# Patient Record
Sex: Female | Born: 1955 | Race: White | Hispanic: No | Marital: Married
Health system: Southern US, Community
[De-identification: ages and names within clinical notes are randomized; demographics above are authoritative.]

---

## 2017-09-26 ENCOUNTER — Ambulatory Visit: Payer: BLUE CROSS/BLUE SHIELD

## 2017-09-26 ENCOUNTER — Ambulatory Visit (INDEPENDENT_AMBULATORY_CARE_PROVIDER_SITE_OTHER): Payer: BLUE CROSS/BLUE SHIELD | Admitting: Podiatry

## 2017-09-26 ENCOUNTER — Encounter: Payer: Self-pay | Admitting: Podiatry

## 2017-09-26 ENCOUNTER — Ambulatory Visit (INDEPENDENT_AMBULATORY_CARE_PROVIDER_SITE_OTHER): Payer: BLUE CROSS/BLUE SHIELD

## 2017-09-26 VITALS — BP 127/83 | HR 96 | Resp 18

## 2017-09-26 DIAGNOSIS — M2042 Other hammer toe(s) (acquired), left foot: Secondary | ICD-10-CM | POA: Diagnosis not present

## 2017-09-26 DIAGNOSIS — M19079 Primary osteoarthritis, unspecified ankle and foot: Secondary | ICD-10-CM | POA: Diagnosis not present

## 2017-09-26 NOTE — Progress Notes (Signed)
Subjective:    Patient ID: Penny Riley, female    DOB: Aug 03, 1955, 62 y.o.   MRN: [id number]  HPI 62 year old female presents the office today for concerns of pain to the bottom of her left fourth toe which is been ongoing since about 2004.  She never had any kind of treatment for it.  She states that sometimes when she wears memory from it seems to hurt the area more.  She denies any recent injury or trauma she denies any numbness or tingling.  She has no other concerns today.   Review of Systems  All other systems reviewed and are negative.  History reviewed. No pertinent past medical history.  History reviewed. No pertinent surgical history.   Current Outpatient Medications:  .  amLODipine (NORVASC) 5 MG tablet, Take 5 mg by mouth daily., Disp: , Rfl:  .  buPROPion (WELLBUTRIN XL) 150 MG 24 hr tablet, Take 150 mg by mouth daily., Disp: , Rfl:   Allergies  Allergen Reactions  . Ciprocin-Fluocin-Procin [Fluocinolone]   . Penicillins     Social History   Socioeconomic History  . Marital status: Married    Spouse name: Not on file  . Number of children: Not on file  . Years of education: Not on file  . Highest education level: Not on file  Occupational History  . Not on file  Social Needs  . Financial resource strain: Not on file  . Food insecurity:    Worry: Not on file    Inability: Not on file  . Transportation needs:    Medical: Not on file    Non-medical: Not on file  Tobacco Use  . Smoking status: Unknown If Ever Smoked  . Smokeless tobacco: Never Used  Substance and Sexual Activity  . Alcohol use: Not on file  . Drug use: Not on file  . Sexual activity: Not on file  Lifestyle  . Physical activity:    Days per week: Not on file    Minutes per session: Not on file  . Stress: Not on file  Relationships  . Social connections:    Talks on phone: Not on file    Gets together: Not on file    Attends religious service: Not on file    Active member of  club or organization: Not on file    Attends meetings of clubs or organizations: Not on file    Relationship status: Not on file  . Intimate partner violence:    Fear of current or ex partner: Not on file    Emotionally abused: Not on file    Physically abused: Not on file    Forced sexual activity: Not on file  Other Topics Concern  . Not on file  Social History Narrative  . Not on file         Objective:   Physical Exam  General: AAO x3, NAD  Dermatological: Skin is warm, dry and supple bilateral. Nails x 10 are well manicured; remaining integument appears unremarkable at this time. There are no open sores, no preulcerative lesions, no rash or signs of infection present.  Vascular: Dorsalis Pedis artery and Posterior Tibial artery pedal pulses are 2/4 bilateral with immedate capillary fill time. Pedal hair growth present. No varicosities and no lower extremity edema present bilateral. There is no pain with calf compression, swelling, warmth, erythema.   Neruologic: Grossly intact via light touch bilateral. Vibratory intact via tuning fork bilateral. Protective threshold with Semmes Wienstein monofilament intact to  all pedal sites bilateral. Patellar and Achilles deep tendon reflexes 2+ bilateral. No Babinski or clonus noted bilateral.   Musculoskeletal: There is a mild bony prominence on the plantar aspect of the left fourth toe at the PIPJ.  There is to be slightly more prominent compared to the other digits.  There is no edema, erythema, increased warmth.  This is where she also has the tenderness.  There is no other area of tenderness identified at this time.  Muscular strength 5/5 in all groups tested bilateral.  Gait: Unassisted, Nonantalgic.     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  There is no evidence of acute fracture or stress fracture. -This is been a chronic issue for her.  Discussed.  Offloading pads to help take pressure off the area as well as the change in shoes.  Overall unfortunately there is not much other treatment we can do for it at this point.  Her pain is intermittent.  We will continue offloading to see how this will help.  She states that she knew that there was not much that could be done for the toe but she wanted to have it checked.  -She also has history of plantar fasciitis.  Discussed gentle stretching exercises as well as inserts for shoes.  Vivi Barrack DPM

## 2017-12-08 ENCOUNTER — Other Ambulatory Visit: Payer: Self-pay | Admitting: Family Medicine

## 2017-12-08 DIAGNOSIS — Z1231 Encounter for screening mammogram for malignant neoplasm of breast: Secondary | ICD-10-CM

## 2018-01-12 ENCOUNTER — Ambulatory Visit
Admission: RE | Admit: 2018-01-12 | Discharge: 2018-01-12 | Disposition: A | Discharge status: Home / Self Care | Payer: BLUE CROSS/BLUE SHIELD | Source: Ambulatory Visit | Attending: Family Medicine | Admitting: Family Medicine

## 2018-01-12 ENCOUNTER — Encounter: Payer: Self-pay | Admitting: Radiology

## 2018-01-12 DIAGNOSIS — Z1231 Encounter for screening mammogram for malignant neoplasm of breast: Secondary | ICD-10-CM

## 2020-03-17 ENCOUNTER — Other Ambulatory Visit: Payer: Self-pay | Admitting: Family Medicine

## 2020-03-17 DIAGNOSIS — Z1231 Encounter for screening mammogram for malignant neoplasm of breast: Secondary | ICD-10-CM

## 2020-03-20 ENCOUNTER — Other Ambulatory Visit: Payer: Self-pay

## 2020-03-20 ENCOUNTER — Ambulatory Visit
Admission: RE | Admit: 2020-03-20 | Discharge: 2020-03-20 | Disposition: A | Discharge status: Home / Self Care | Payer: BLUE CROSS/BLUE SHIELD | Source: Ambulatory Visit | Attending: Family Medicine | Admitting: Family Medicine

## 2020-03-20 DIAGNOSIS — Z1231 Encounter for screening mammogram for malignant neoplasm of breast: Secondary | ICD-10-CM

## 2020-05-10 ENCOUNTER — Other Ambulatory Visit: Payer: Self-pay | Admitting: Gastroenterology

## 2020-05-10 DIAGNOSIS — R748 Abnormal levels of other serum enzymes: Secondary | ICD-10-CM

## 2020-05-12 ENCOUNTER — Ambulatory Visit
Admission: RE | Admit: 2020-05-12 | Discharge: 2020-05-12 | Disposition: A | Discharge status: Home / Self Care | Payer: 59 | Source: Ambulatory Visit | Attending: Gastroenterology | Admitting: Gastroenterology

## 2020-05-12 DIAGNOSIS — R748 Abnormal levels of other serum enzymes: Secondary | ICD-10-CM

## 2020-05-17 ENCOUNTER — Other Ambulatory Visit: Payer: Self-pay | Admitting: Gastroenterology

## 2020-05-17 DIAGNOSIS — R9389 Abnormal findings on diagnostic imaging of other specified body structures: Secondary | ICD-10-CM

## 2020-05-17 DIAGNOSIS — N289 Disorder of kidney and ureter, unspecified: Secondary | ICD-10-CM

## 2020-05-23 ENCOUNTER — Other Ambulatory Visit: Payer: Self-pay | Admitting: Gastroenterology

## 2020-05-23 ENCOUNTER — Other Ambulatory Visit (HOSPITAL_COMMUNITY): Payer: Self-pay | Admitting: Gastroenterology

## 2020-05-23 DIAGNOSIS — K754 Autoimmune hepatitis: Secondary | ICD-10-CM

## 2020-05-24 ENCOUNTER — Other Ambulatory Visit: Payer: Self-pay | Admitting: Gastroenterology

## 2020-05-31 ENCOUNTER — Ambulatory Visit
Admission: RE | Admit: 2020-05-31 | Discharge: 2020-05-31 | Disposition: A | Discharge status: Home / Self Care | Payer: 59 | Source: Ambulatory Visit | Attending: Gastroenterology | Admitting: Gastroenterology

## 2020-05-31 DIAGNOSIS — N289 Disorder of kidney and ureter, unspecified: Secondary | ICD-10-CM

## 2020-05-31 DIAGNOSIS — R9389 Abnormal findings on diagnostic imaging of other specified body structures: Secondary | ICD-10-CM

## 2020-05-31 MED ORDER — IOPAMIDOL (ISOVUE-300) INJECTION 61%
100.0000 mL | Freq: Once | INTRAVENOUS | Status: AC | PRN
  Administered 2020-05-31: 100 mL via INTRAVENOUS

## 2020-06-07 ENCOUNTER — Other Ambulatory Visit: Payer: Self-pay | Admitting: Student

## 2020-06-07 NOTE — H&P (Signed)
Chief Complaint: Patient was seen in consultation today for a random liver biopsy  Referring Physician(s): Kerin Salen  Supervising Physician: Oley Balm  Patient Status: [facility] - Out-pt  History of Present Illness: Penny Riley is a 64 y.o. female with a medical history significant for depression and hypertension. Recent lab work prior to her colonoscopy revealed elevated alkaline phosphatase levels. Additional labs were ordered and revealed high levels of actin smooth muscle antibodies. There is concern for autoimmune hepatitis.   Interventional Radiology has been asked to evaluate this patient for an image-guided random liver biopsy for further work up and evaluation.   Allergies: Ciprofloxacin and Penicillins  Medications: Prior to Admission medications   Medication Sig Start Date End Date Taking? Authorizing Provider  acetaminophen (TYLENOL) 500 MG tablet Take 500-1,000 mg by mouth every 6 (six) hours as needed for moderate pain or headache.   Yes [provider]  amLODipine (NORVASC) 5 MG tablet Take 5 mg by mouth daily.   Yes [provider]  atorvastatin (LIPITOR) 20 MG tablet Take 20 mg by mouth daily.   Yes [provider]  buPROPion (WELLBUTRIN XL) 150 MG 24 hr tablet Take 150 mg by mouth daily. Brand Name   Yes [provider]  Ferrous Sulfate (SLOW FE) 142 (45 Fe) MG TBCR Take 45 mg by mouth daily.   Yes [provider]  fluticasone (FLONASE) 50 MCG/ACT nasal spray Place 1 spray into both nostrils daily as needed for allergies or rhinitis.   Yes [provider]  ibuprofen (ADVIL) 200 MG tablet Take 400 mg by mouth every 6 (six) hours as needed for headache or moderate pain.   Yes [provider]  phenylephrine (SUDAFED PE) 10 MG TABS tablet Take 10 mg by mouth every 4 (four) hours as needed (congestion).    Yes [provider]     History reviewed. No pertinent family history.  Social  History   Socioeconomic History  . Marital status: Married    Spouse name: Not on file  . Number of children: Not on file  . Years of education: Not on file  . Highest education level: Not on file  Occupational History  . Not on file  Tobacco Use  . Smoking status: Unknown If Ever Smoked  . Smokeless tobacco: Never Used  Vaping Use  . Vaping Use: Never used  Substance and Sexual Activity  . Alcohol use: Never  . Drug use: Never  . Sexual activity: Not on file  Other Topics Concern  . Not on file  Social History Narrative  . Not on file   Social Determinants of Health   Financial Resource Strain: Not on file  Food Insecurity: Not on file  Transportation Needs: Not on file  Physical Activity: Not on file  Stress: Not on file  Social Connections: Not on file    Review of Systems: A 12 point ROS discussed and pertinent positives are indicated in the HPI above.  All other systems are negative.  Review of Systems  Constitutional: Negative for appetite change and fatigue.  Respiratory: Negative for cough.   Cardiovascular: Negative for chest pain and leg swelling.  Gastrointestinal: Negative for abdominal pain, diarrhea, nausea and vomiting.  Genitourinary: Negative for flank pain.  Musculoskeletal: Negative for back pain.  Neurological: Negative for headaches.  Hematological: Does not bruise/bleed easily.    Vital Signs: BP 136/86   Pulse 82   Temp 97.8 F (36.6 C) (Oral)   Resp 16  Ht 5' 7.5" (1.715 m)   Wt 207 lb (93.9 kg)   SpO2 96%   BMI 31.94 kg/m   Physical Exam Constitutional:      General: She is not in acute distress. HENT:     Mouth/Throat:     Mouth: Mucous membranes are moist.     Pharynx: Oropharynx is clear.  Cardiovascular:     Rate and Rhythm: Normal rate and regular rhythm.     Pulses: Normal pulses.     Heart sounds: Normal heart sounds.  Pulmonary:     Effort: Pulmonary effort is normal.     Breath sounds: Normal breath sounds.   Abdominal:     General: Bowel sounds are normal.     Palpations: Abdomen is soft.  Musculoskeletal:        General: Normal range of motion.  Skin:    General: Skin is warm and dry.  Neurological:     Mental Status: She is alert and oriented to person, place, and time.     Imaging: CT ABDOMEN W WO CONTRAST  Result Date: 06/01/2020 CLINICAL DATA:  Lesion on RIGHT kidney on ultrasound of the abdomen of May 12, 2020 in this 64 year old female EXAM: CT ABDOMEN WITHOUT AND WITH CONTRAST TECHNIQUE: Multidetector CT imaging of the abdomen was performed following the standard protocol before and following the bolus administration of intravenous contrast. CONTRAST:  ISOVUE-300 IOPAMIDOL (ISOVUE-300) INJECTION 61% COMPARISON:  May 12, 2020 FINDINGS: Lower chest: Lung bases are clear. Hepatobiliary: No focal, suspicious hepatic lesion. Portal vein is patent. No pericholecystic stranding or biliary duct dilation. Pancreas: Normal Spleen: Normal Adrenals/Urinary Tract: Adrenal glands are normal. Symmetric renal enhancement. Mild RIGHT renal cortical scarring in the interpolar RIGHT kidney 1.6 x 1.5 cm area arising from the upper pole the RIGHT kidney shows baseline density of 20 Hounsfield units, postcontrast density of 24 Hounsfield units and delayed density of 17 Hounsfield units. No suspicious renal lesions are demonstrated. No hydronephrosis. Stomach/Bowel: Small hiatal hernia. Otherwise unremarkable appearance of the visualized gastrointestinal tract. Vascular/Lymphatic: Minimal atherosclerotic calcification of the abdominal aorta. No aneurysmal dilation. There is no gastrohepatic or hepatoduodenal ligament lymphadenopathy. No retroperitoneal or mesenteric lymphadenopathy. Other: No ascites Musculoskeletal: Spinal degenerative changes without acute or destructive bone process IMPRESSION: 1. Area of concern arising from the upper pole the RIGHT kidney represents a cyst perhaps with small  amount of proteinaceous or hemorrhagic contents, no mural nodularity or suspicious features, Bosniak category II. 2. Small hiatal hernia. 3. Aortic atherosclerosis. Aortic Atherosclerosis (ICD10-I70.0). Electronically Signed   By: Donzetta Kohut M.D.   On: 06/01/2020 09:56   US Abdomen Limited RUQ (LIVER/GB)  Result Date: 05/12/2020 CLINICAL DATA:  Elevated alkaline phosphatase EXAM: ULTRASOUND ABDOMEN LIMITED RIGHT UPPER QUADRANT COMPARISON:  None. FINDINGS: Gallbladder: No gallstones or wall thickening visualized. No sonographic Murphy sign noted by sonographer. Common bile duct: Diameter: 3 mm. Liver: No focal lesion identified. Mildly increased hepatic parenchymal echogenicity. Portal vein is patent on color Doppler imaging with normal direction of blood flow towards the liver. Other: Adjacent to the superior pole the right kidney is a rounded heterogeneously hypoechoic lesion measuring 1.8 x 1.5 x 1.7 cm. No internal vascularity with color Doppler. No posterior acoustic features. IMPRESSION: 1. The echogenicity of the liver is increased. This is a nonspecific finding but is most commonly seen with fatty infiltration of the liver. There are no obvious focal liver lesions. 2. Nonspecific rounded hypoechoic lesion measuring 1.8 cm located superior to the right pole of  the kidney. This may represent a complex exophytic renal cyst. A lesion of adrenal origin is also a consideration. Correlation with prior cross-sectional imaging through the upper abdomen is recommended. If no comparison imaging is available, consider contrast enhanced CT of the abdomen. Electronically Signed   By: Duanne Guess D.O.   On: 05/12/2020 16:14    Labs:  CBC: No results for input(s): WBC, HGB, HCT, PLT in the last 8760 hours.  COAGS: No results for input(s): INR, APTT in the last 8760 hours.  BMP: No results for input(s): NA, K, CL, CO2, GLUCOSE, BUN, CALCIUM, CREATININE, GFRNONAA, GFRAA in the last 8760  hours.  Invalid input(s): CMP  LIVER FUNCTION TESTS: No results for input(s): BILITOT, AST, ALT, ALKPHOS, PROT, ALBUMIN in the last 8760 hours.  TUMOR MARKERS: No results for input(s): AFPTM, CEA, CA199, CHROMGRNA in the last 8760 hours.  Assessment and Plan:  Elevated alkaline phosphatase and ASMA levels; concern for autoimmune hepatitis: Daleen Squibb, 64 year old female, presents today to the [facility] Interventional Radiology department for an image-guided random liver biopsy.  Risks and benefits of a random liver biopsy were discussed with the patient including, but not limited to bleeding, infection, damage to adjacent structures or low yield requiring additional tests.  All of the questions were answered and there is agreement to proceed.  The patient has been NPO. Vitals have been reviewed. Labs are pending but will be reviewed prior to the start of the procedure.   Consent signed and in chart.  Thank you for this interesting consult.  I greatly enjoyed meeting Remona Boom and look forward to participating in their care.  A copy of this report was sent to the requesting provider on this date.  Electronically Signed: Alwyn Ren, AGACNP-BC [id number] 06/08/2020, 6:59 AM   I spent a total of  30 Minutes   in face to face in clinical consultation, greater than 50% of which was counseling/coordinating care for image-guided random liver biopsy.

## 2020-06-08 ENCOUNTER — Encounter (HOSPITAL_COMMUNITY): Payer: Self-pay

## 2020-06-08 ENCOUNTER — Ambulatory Visit (HOSPITAL_COMMUNITY)
Admission: RE | Admit: 2020-06-08 | Discharge: 2020-06-08 | Disposition: A | Discharge status: Home / Self Care | Payer: 59 | Source: Ambulatory Visit | Attending: Gastroenterology | Admitting: Gastroenterology

## 2020-06-08 ENCOUNTER — Other Ambulatory Visit: Payer: Self-pay

## 2020-06-08 ENCOUNTER — Other Ambulatory Visit: Payer: Self-pay | Admitting: Physician Assistant

## 2020-06-08 DIAGNOSIS — K760 Fatty (change of) liver, not elsewhere classified: Secondary | ICD-10-CM | POA: Diagnosis not present

## 2020-06-08 DIAGNOSIS — K754 Autoimmune hepatitis: Secondary | ICD-10-CM

## 2020-06-08 DIAGNOSIS — K759 Inflammatory liver disease, unspecified: Secondary | ICD-10-CM | POA: Diagnosis not present

## 2020-06-08 LAB — CBC
HCT: 39.2 % (ref 36.0–46.0)
Hemoglobin: 12.6 g/dL (ref 12.0–15.0)
MCH: 29.2 pg (ref 26.0–34.0)
MCHC: 32.1 g/dL (ref 30.0–36.0)
MCV: 91 fL (ref 80.0–100.0)
Platelets: 352 10*3/uL (ref 150–400)
RBC: 4.31 MIL/uL (ref 3.87–5.11)
RDW: 13.4 % (ref 11.5–15.5)
WBC: 7.1 10*3/uL (ref 4.0–10.5)
nRBC: 0 % (ref 0.0–0.2)

## 2020-06-08 LAB — PROTIME-INR
INR: 1 (ref 0.8–1.2)
Prothrombin Time: 12.6 seconds (ref 11.4–15.2)

## 2020-06-08 MED ORDER — SODIUM CHLORIDE 0.9 % IV SOLN: INTRAVENOUS | Status: DC

## 2020-06-08 MED ORDER — FENTANYL CITRATE (PF) 100 MCG/2ML IJ SOLN
INTRAMUSCULAR | Status: AC
  Filled 2020-06-08: qty 2

## 2020-06-08 MED ORDER — LIDOCAINE HCL (PF) 1 % IJ SOLN
INTRAMUSCULAR | Status: AC
  Filled 2020-06-08: qty 30

## 2020-06-08 MED ORDER — MIDAZOLAM HCL 2 MG/2ML IJ SOLN
INTRAMUSCULAR | Status: AC
  Filled 2020-06-08: qty 2

## 2020-06-08 MED ORDER — SODIUM CHLORIDE 0.9 % IV SOLN
INTRAVENOUS | Status: AC | PRN
  Administered 2020-06-08: 10 mL/h via INTRAVENOUS

## 2020-06-08 MED ORDER — GELATIN ABSORBABLE 12-7 MM EX MISC
CUTANEOUS | Status: AC
  Filled 2020-06-08: qty 1

## 2020-06-08 MED ORDER — HYDROCODONE-ACETAMINOPHEN 5-325 MG PO TABS: 1.0000 | ORAL_TABLET | ORAL | Status: DC | PRN

## 2020-06-08 MED ORDER — MIDAZOLAM HCL 2 MG/2ML IJ SOLN
INTRAMUSCULAR | Status: AC | PRN
  Administered 2020-06-08: 1 mg via INTRAVENOUS
  Administered 2020-06-08: 0.5 mg via INTRAVENOUS

## 2020-06-08 MED ORDER — FENTANYL CITRATE (PF) 100 MCG/2ML IJ SOLN
INTRAMUSCULAR | Status: AC | PRN
  Administered 2020-06-08: 25 ug via INTRAVENOUS
  Administered 2020-06-08: 50 ug via INTRAVENOUS

## 2020-06-08 NOTE — Sedation Documentation (Signed)
Called to give report. Nurse unavailable. Will call back 

## 2020-06-08 NOTE — Procedures (Signed)
  Procedure: US guided core biopsy liver   EBL:   minimal Complications:  none immediate  See full dictation in [employer].  Thora Lance MD Main # [phone] Pager  [phone] Mobile [phone]

## 2020-06-08 NOTE — Discharge Instructions (Addendum)

## 2020-06-12 LAB — SURGICAL PATHOLOGY

## 2020-12-05 DIAGNOSIS — R748 Abnormal levels of other serum enzymes: Secondary | ICD-10-CM | POA: Diagnosis not present

## 2021-03-20 DIAGNOSIS — Z23 Encounter for immunization: Secondary | ICD-10-CM | POA: Diagnosis not present

## 2021-03-20 DIAGNOSIS — R7303 Prediabetes: Secondary | ICD-10-CM | POA: Diagnosis not present

## 2021-03-20 DIAGNOSIS — J309 Allergic rhinitis, unspecified: Secondary | ICD-10-CM | POA: Diagnosis not present

## 2021-03-20 DIAGNOSIS — E785 Hyperlipidemia, unspecified: Secondary | ICD-10-CM | POA: Diagnosis not present

## 2021-03-20 DIAGNOSIS — S61214A Laceration without foreign body of right ring finger without damage to nail, initial encounter: Secondary | ICD-10-CM | POA: Diagnosis not present

## 2021-03-20 DIAGNOSIS — Z0000 Encounter for general adult medical examination without abnormal findings: Secondary | ICD-10-CM | POA: Diagnosis not present

## 2021-03-22 ENCOUNTER — Other Ambulatory Visit: Payer: Self-pay | Admitting: Family Medicine

## 2021-03-22 DIAGNOSIS — R5381 Other malaise: Secondary | ICD-10-CM

## 2021-03-23 ENCOUNTER — Other Ambulatory Visit: Payer: Self-pay | Admitting: Family Medicine

## 2021-03-23 DIAGNOSIS — E2839 Other primary ovarian failure: Secondary | ICD-10-CM

## 2021-03-23 DIAGNOSIS — Z1231 Encounter for screening mammogram for malignant neoplasm of breast: Secondary | ICD-10-CM

## 2021-07-05 IMAGING — MG DIGITAL SCREENING BILAT W/ TOMO W/ CAD
8 series · 8 of 24 positions shown · non-contrast
Comparison: Previous exam(s).

CLINICAL DATA: Screening.

EXAM:
DIGITAL SCREENING BILATERAL MAMMOGRAM WITH TOMO AND CAD

[L MLO synth-2D]
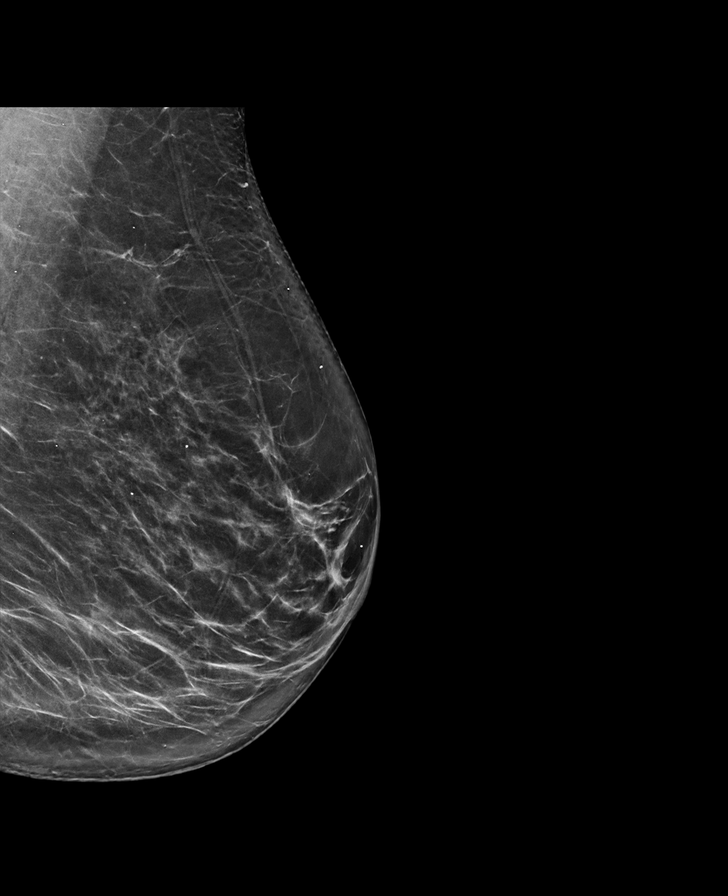

[R CC synth-2D]
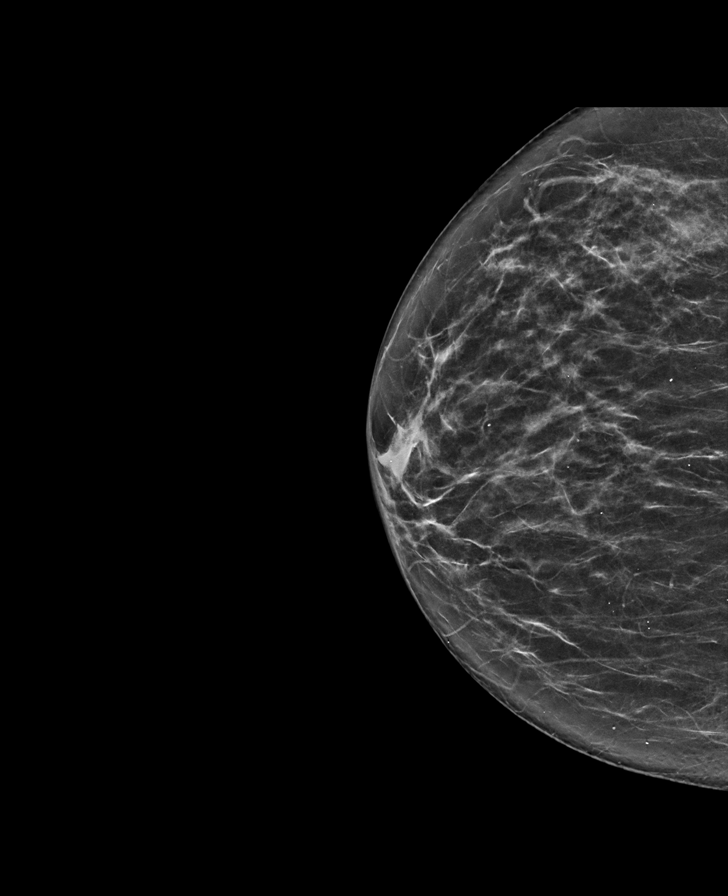

[L CC synth-2D]
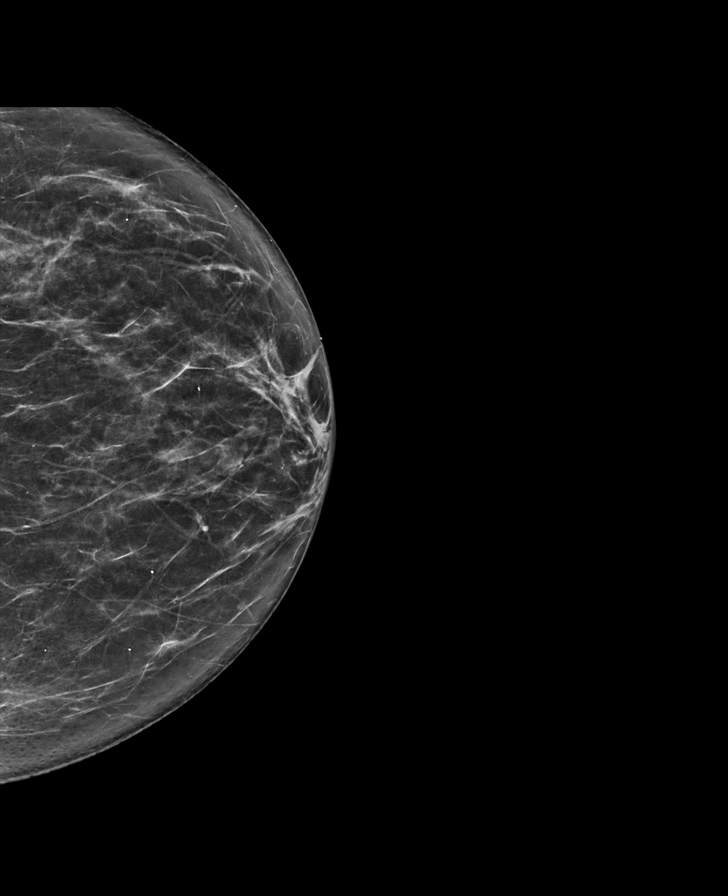

[R MLO synth-2D]
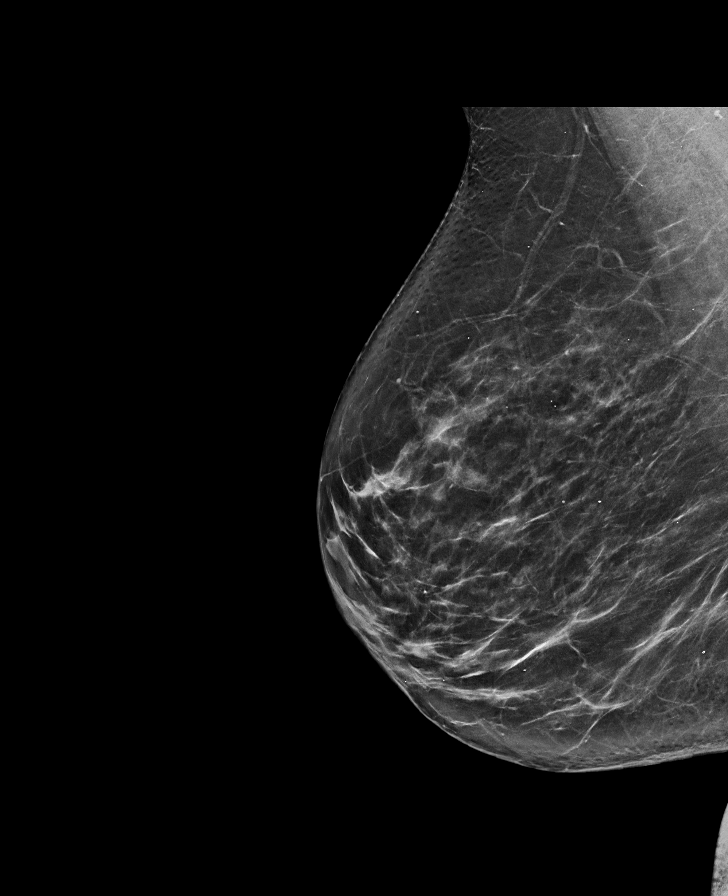

[L CC tomo · tomo slice 37/74.0]
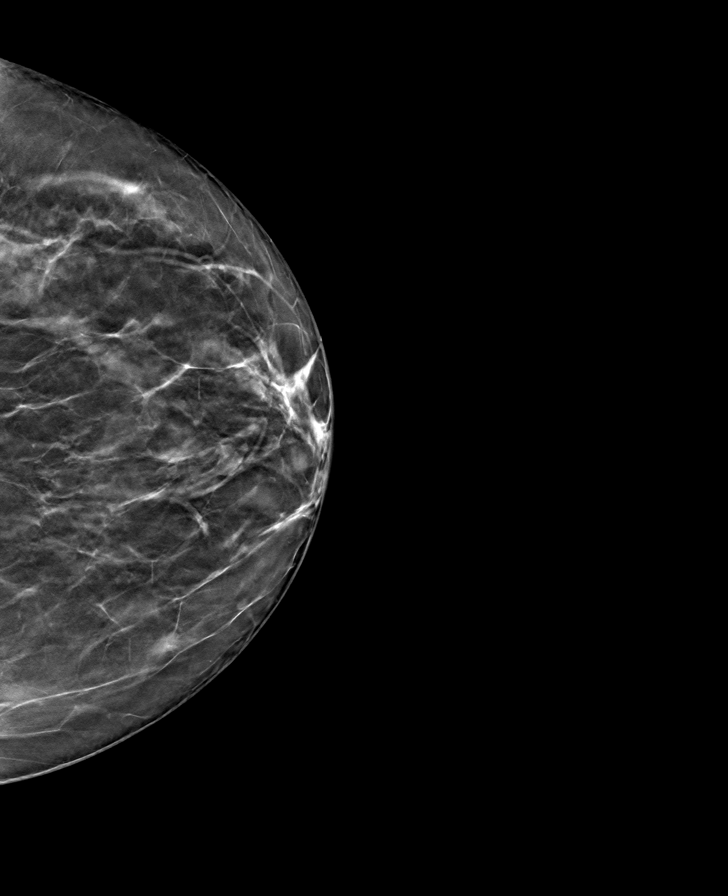

[L MLO tomo · tomo slice 41/80.0]
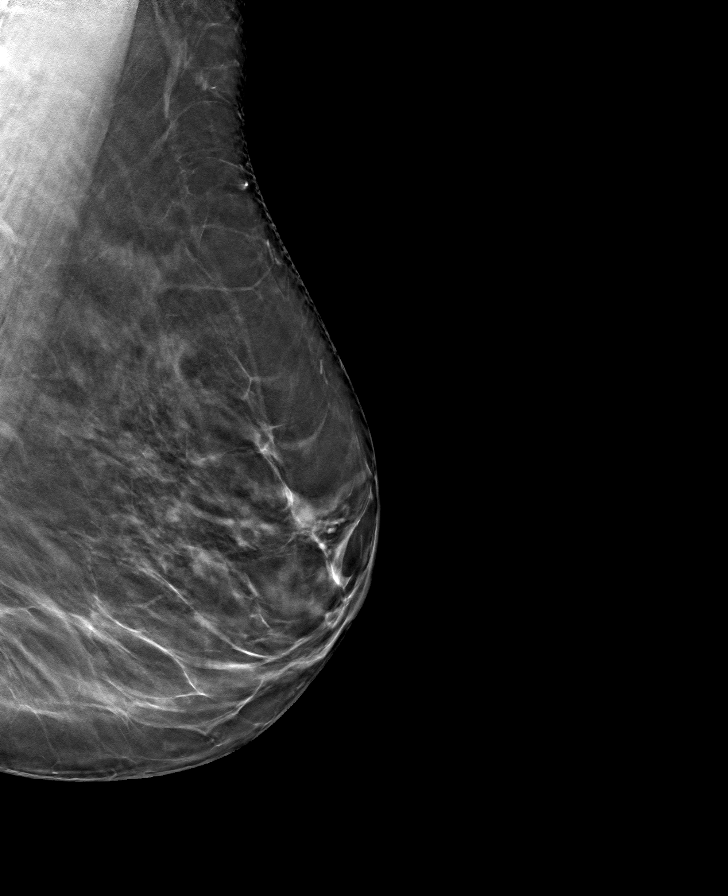

[R MLO tomo · tomo slice 41/82.0]
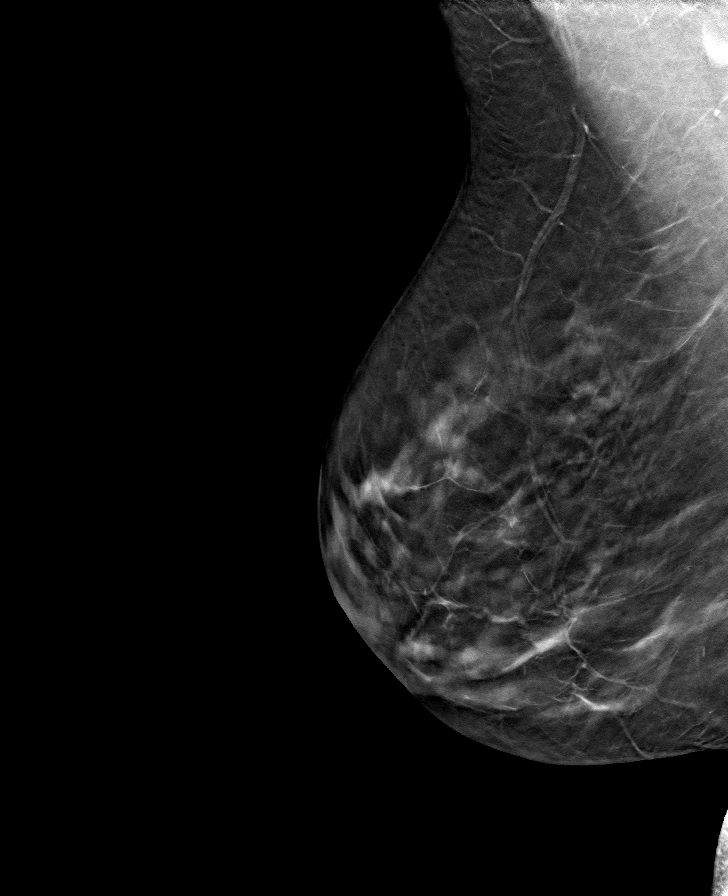

[R CC tomo · tomo slice 36/71.0]
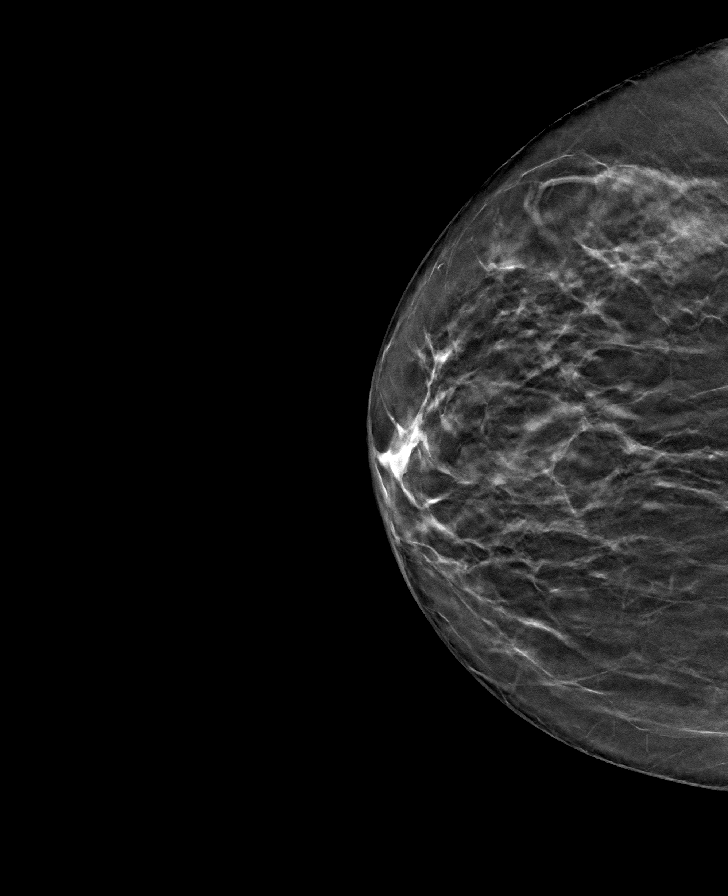

[8 of 24 positions shown; findings below may reference images not displayed]

ACR Breast Density Category b: There are scattered areas of
fibroglandular density.
FINDINGS: There are no findings suspicious for malignancy. Images were
processed with CAD.
IMPRESSION: No mammographic evidence of malignancy. A result letter of this
screening mammogram will be mailed directly to the patient.

RECOMMENDATION:
Screening mammogram in one year. (Code:[id number])

BI-RADS CATEGORY  1: Negative.

## 2021-08-27 IMAGING — US US ABDOMEN LIMITED
1 series · 13 of 25 positions shown · non-contrast
Comparison: None.

CLINICAL DATA: Elevated alkaline phosphatase

EXAM:
ULTRASOUND ABDOMEN LIMITED RIGHT UPPER QUADRANT

[Series 1: us abdomen limited · 0.23mm/px · 39 acquisitions, 13 frames shown]
[im 1/39]
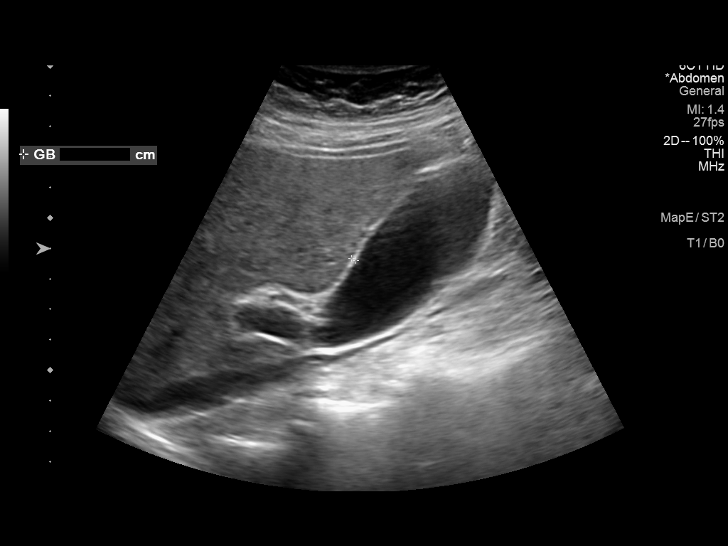
[im 4/39]
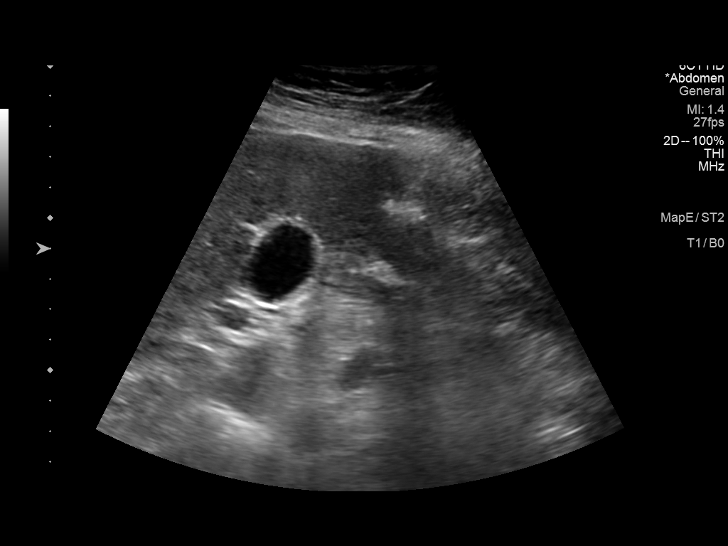
[im 7/39]
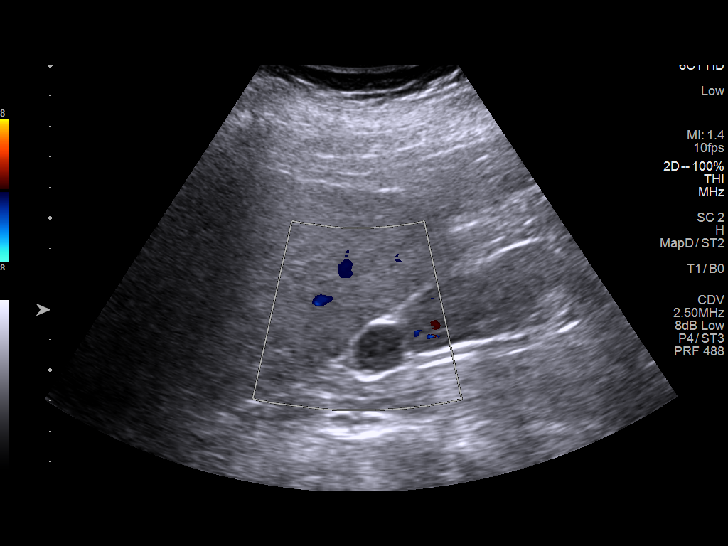
[im 10/39]
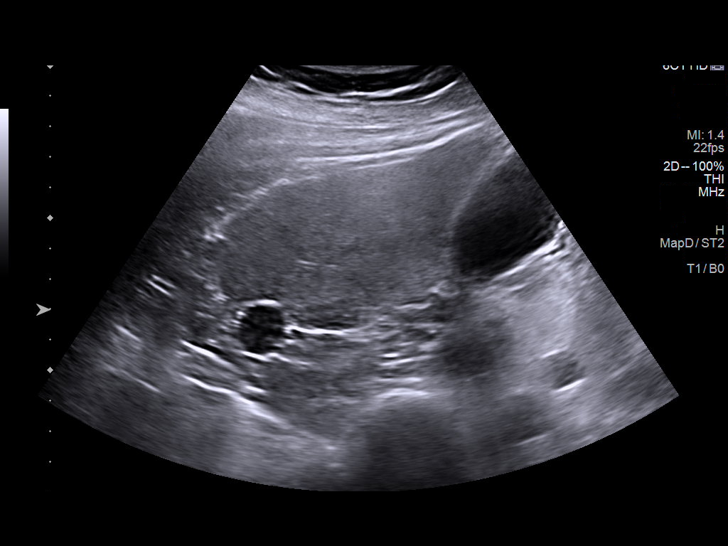
[im 13/39]
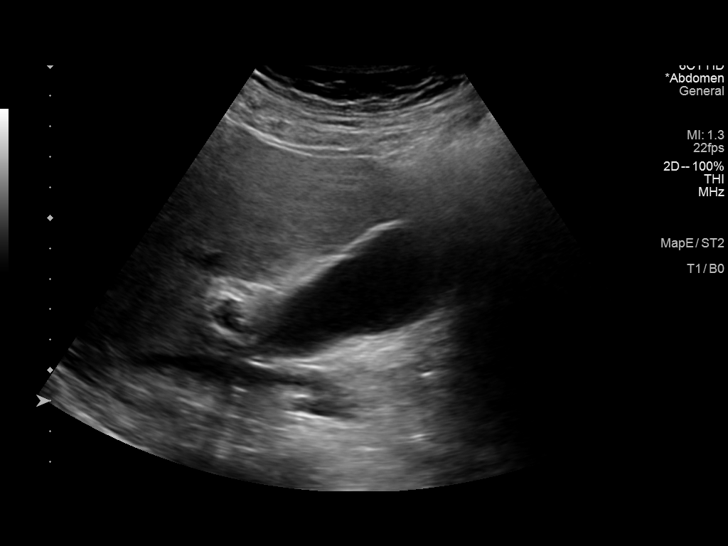
[im 16/39]
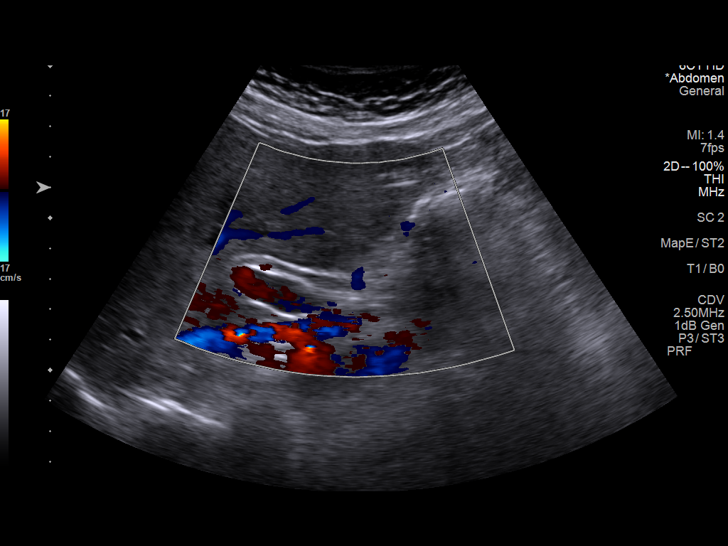
[im 20/39]
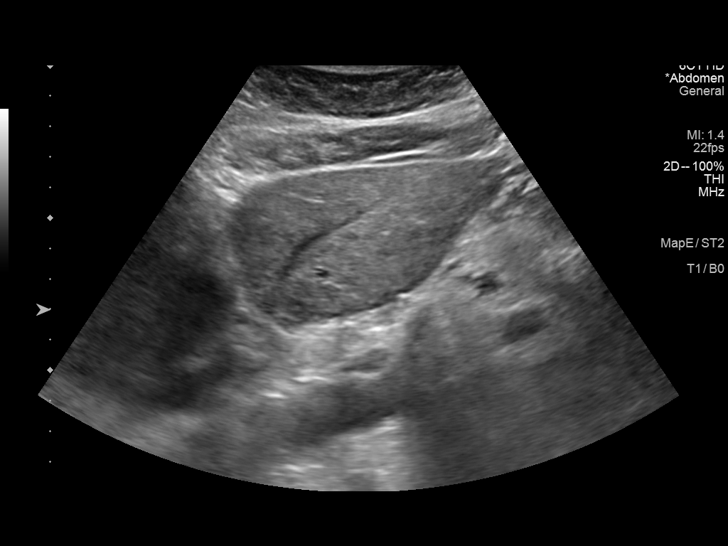
[im 23/39]
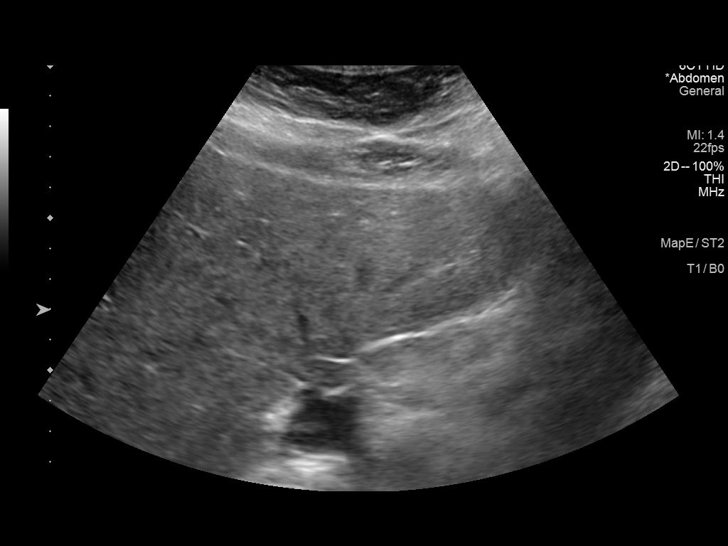
[im 26/39]
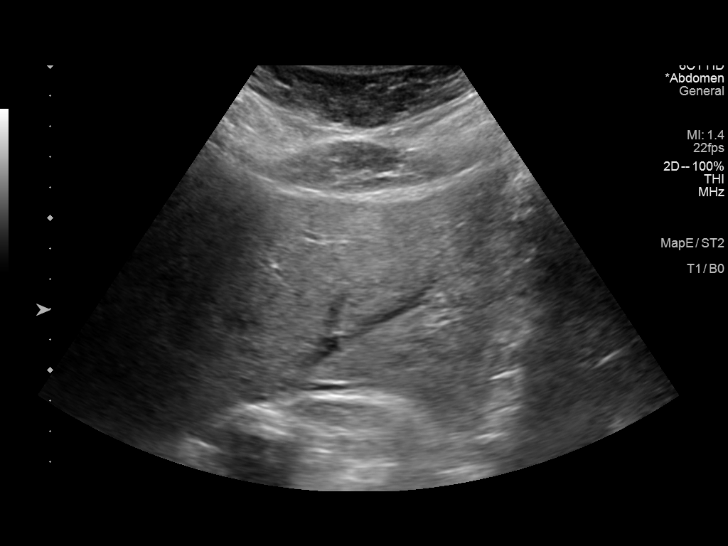
[im 29/39]
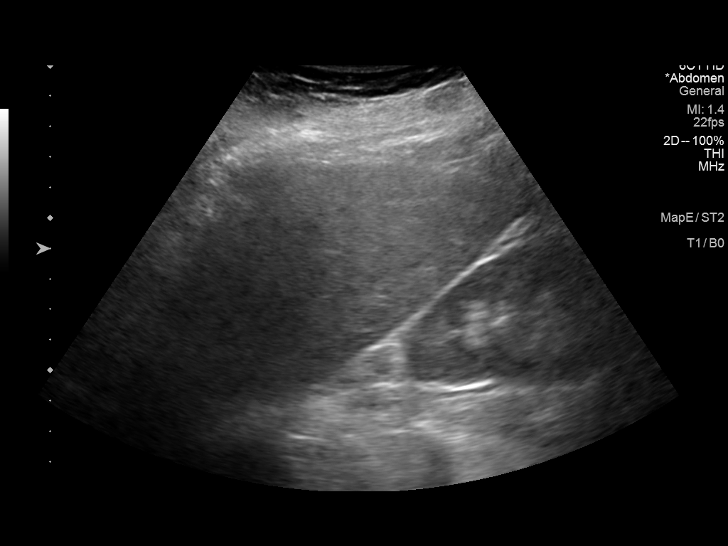
[im 32/39]
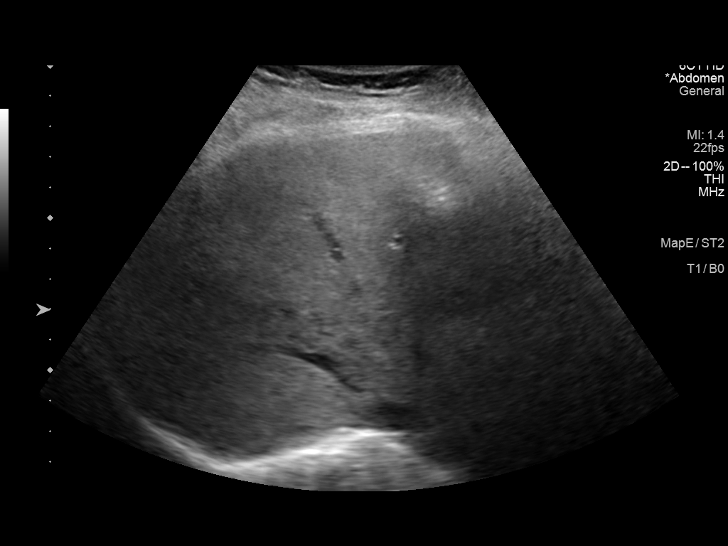
[im 35/39]
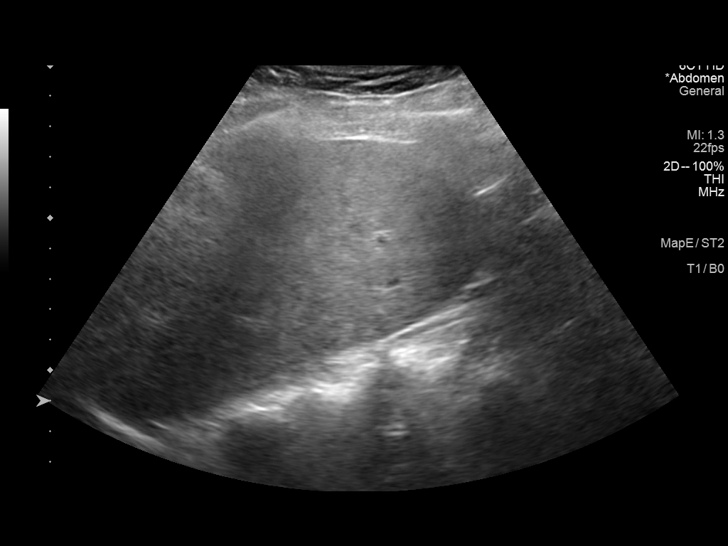
[im 39/39]
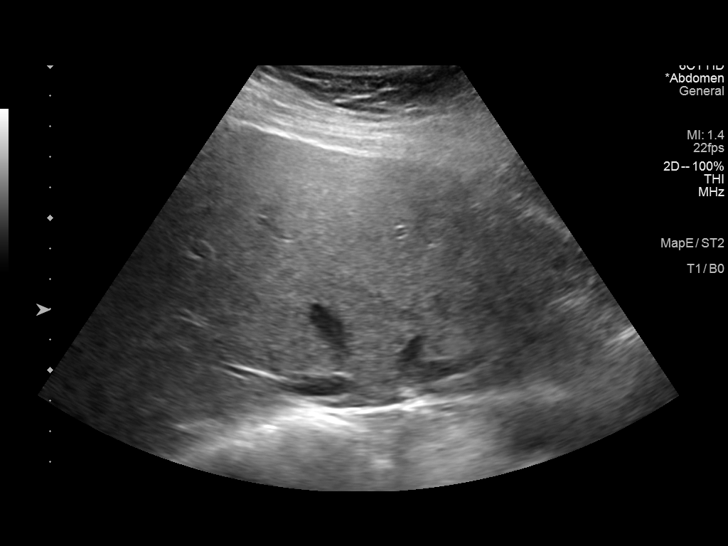

[13 of 25 positions shown; findings below may reference images not displayed]

FINDINGS: Gallbladder:

No gallstones or wall thickening visualized. No sonographic Murphy
sign noted by sonographer.

Common bile duct:

Diameter: 3 mm.

Liver:

No focal lesion identified. Mildly increased hepatic parenchymal
echogenicity. Portal vein is patent on color Doppler imaging with
normal direction of blood flow towards the liver.

Other: Adjacent to the superior pole the right kidney is a rounded
heterogeneously hypoechoic lesion measuring 1.8 x 1.5 x 1.7 cm. No
internal vascularity with color Doppler. No posterior acoustic
features.
IMPRESSION: 1. The echogenicity of the liver is increased. This is a nonspecific
finding but is most commonly seen with fatty infiltration of the
liver. There are no obvious focal liver lesions.
2. Nonspecific rounded hypoechoic lesion measuring 1.8 cm located
superior to the right pole of the kidney. This may represent a
complex exophytic renal cyst. A lesion of adrenal origin is also a
consideration. Correlation with prior cross-sectional imaging
through the upper abdomen is recommended. If no comparison imaging
is available, consider contrast enhanced CT of the abdomen.

## 2021-09-15 IMAGING — CT CT ABDOMEN WO/W CM
3 of 7 series · 13 of 32 positions shown, 18 images · IV contrast (iopamidol)
Comparison: May 12, 2020

CLINICAL DATA: Lesion on RIGHT kidney on ultrasound of the abdomen
of May 12, 2020 in this 64-year-old female

EXAM:
CT ABDOMEN WITHOUT AND WITH CONTRAST
TECHNIQUE: Multidetector CT imaging of the abdomen was performed following the
standard protocol before and following the bolus administration of
intravenous contrast.
CONTRAST:  100mL 8RNX4I-PTT IOPAMIDOL (8RNX4I-PTT) INJECTION 61%

[Series 2: abd w/(date) · axial · 0.76mm/px · z∈[-269,-110]mm · 5 of 81 slices shown, 10 images]
[im 14/81  soft-tissue]
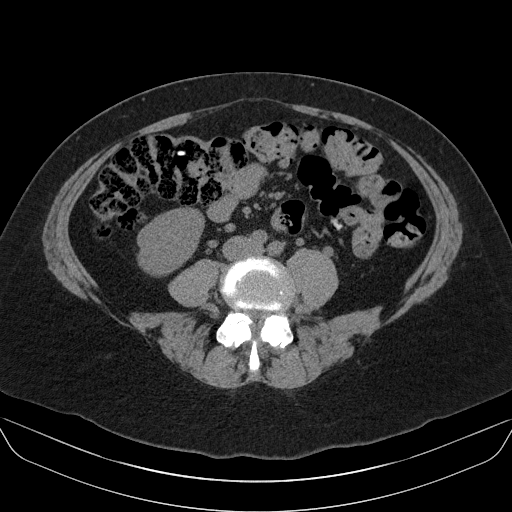
[im 14/81  bone]
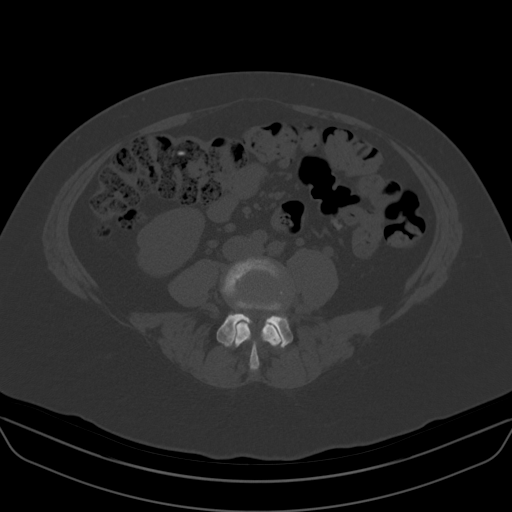
[im 27/81  soft-tissue]
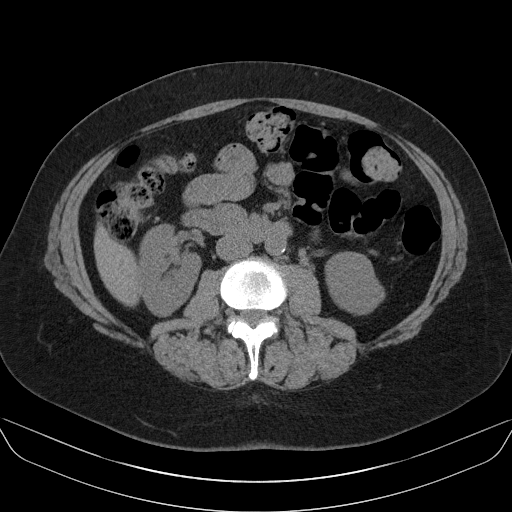
[im 27/81  lung]
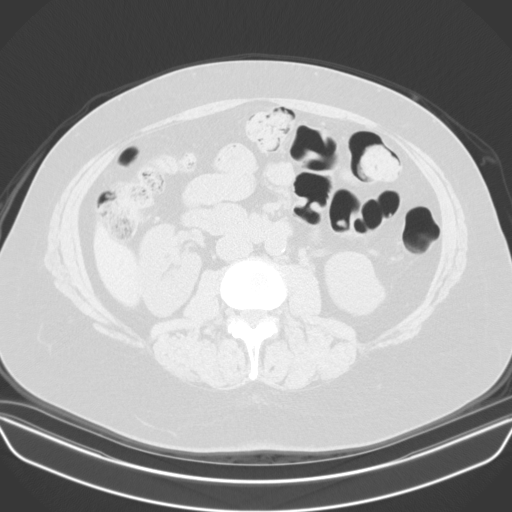
[im 41/81  soft-tissue]
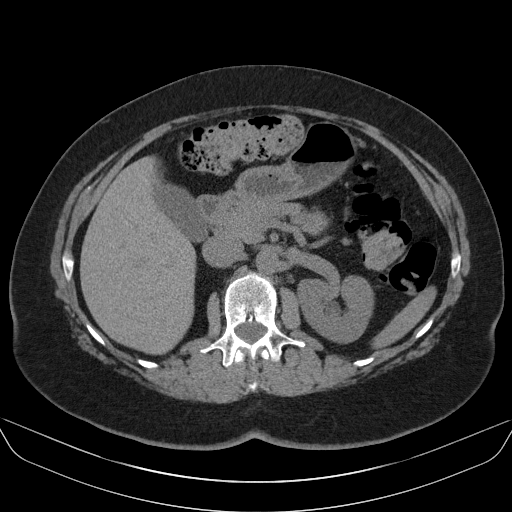
[im 41/81  lung]
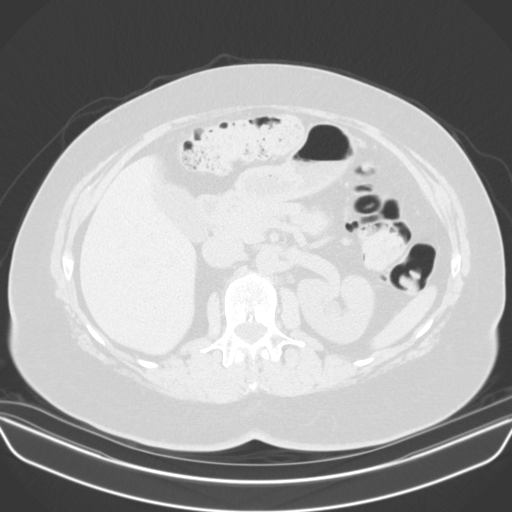
[im 54/81  soft-tissue]
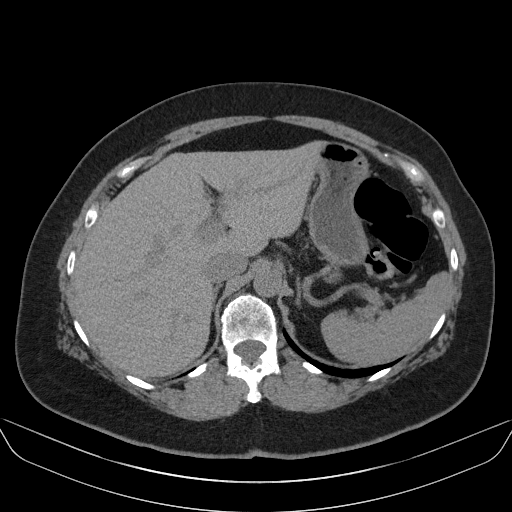
[im 54/81  lung]
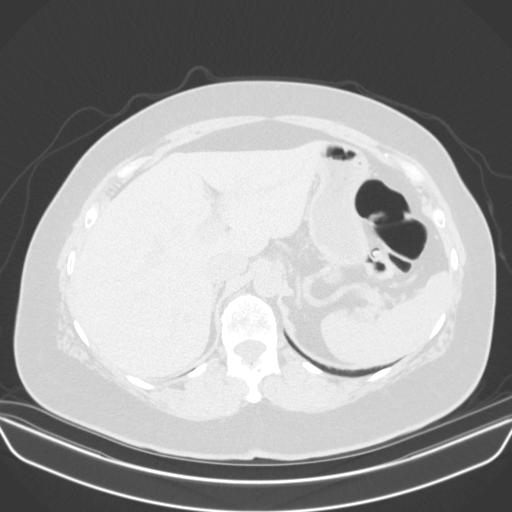
[im 67/81  soft-tissue]
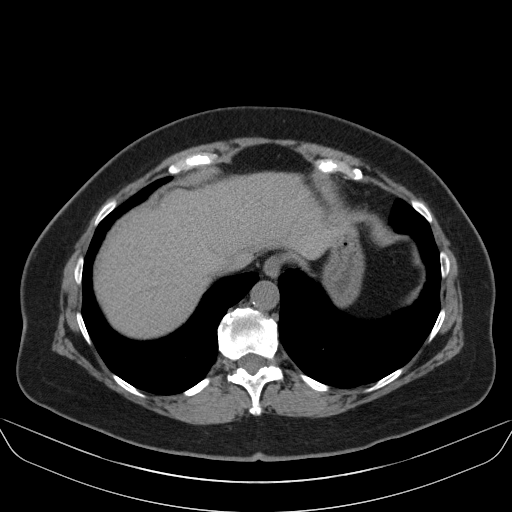
[im 67/81  lung]
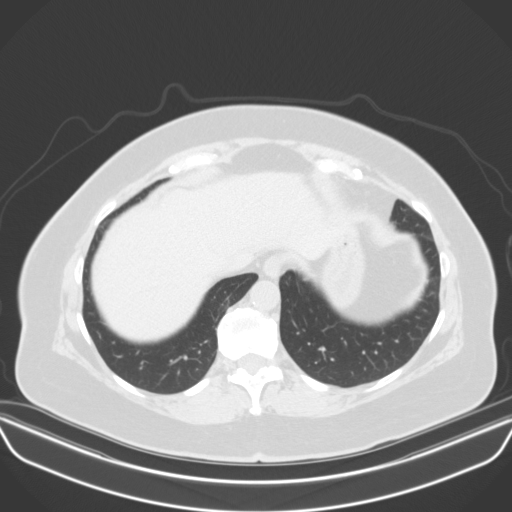

[Series 3: arterial · axial · arterial · 0.76mm/px · z∈[-263,-119]mm · 4 of 82 slices shown]
[im 17/82  soft-tissue]
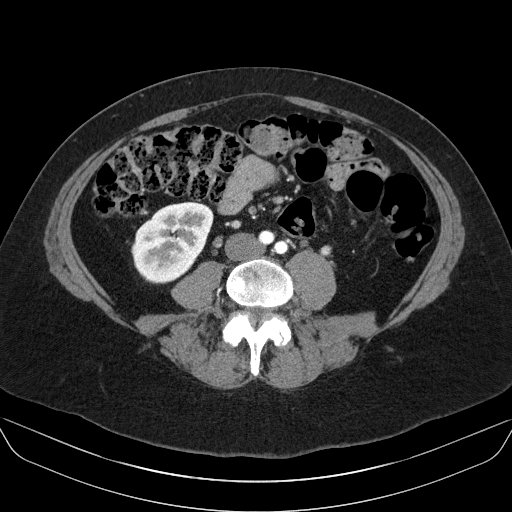
[im 33/82  soft-tissue]
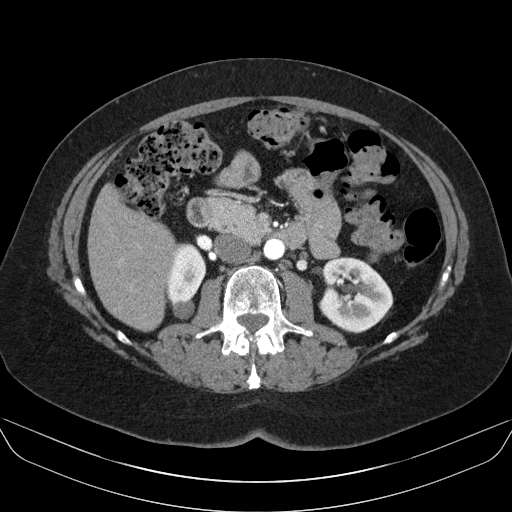
[im 49/82  soft-tissue]
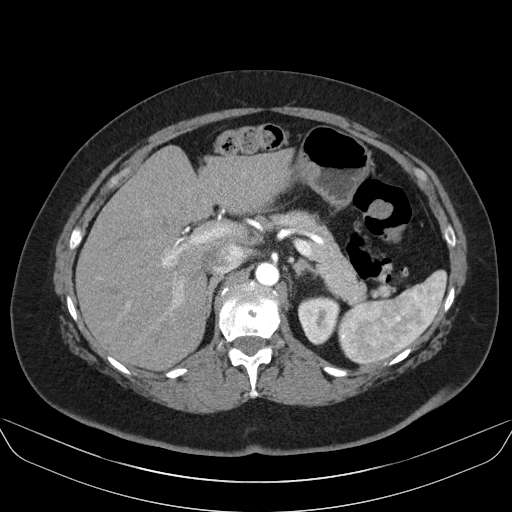
[im 65/82  soft-tissue]
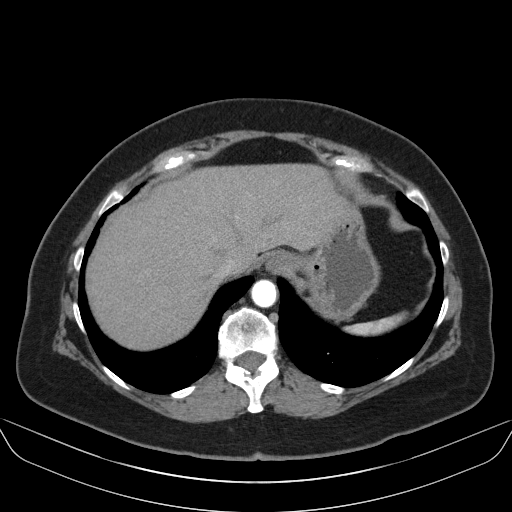

[Series 5: nephrographic · axial · 0.76mm/px · z∈[-263,-119]mm · 4 of 82 slices shown]
[im 17/82  soft-tissue]
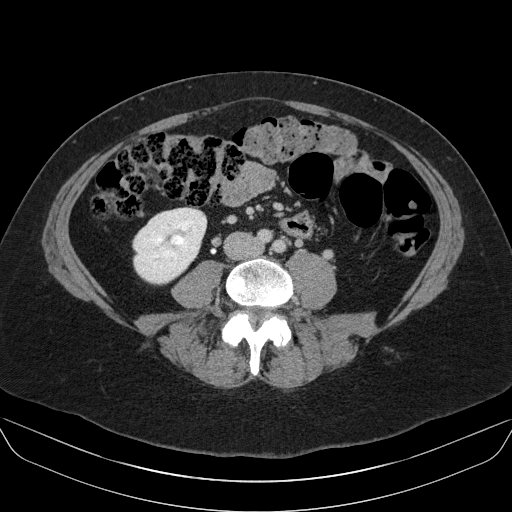
[im 33/82  soft-tissue]
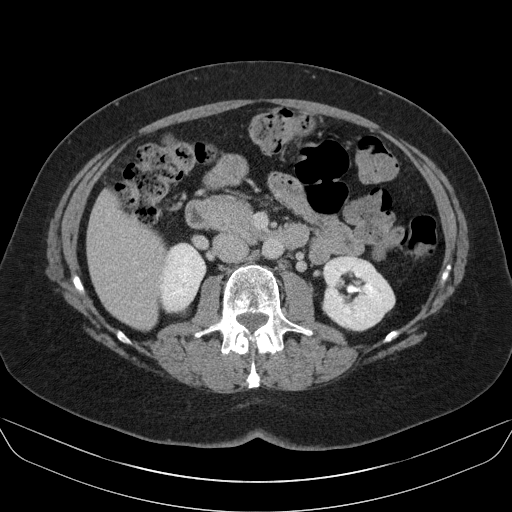
[im 49/82  soft-tissue]
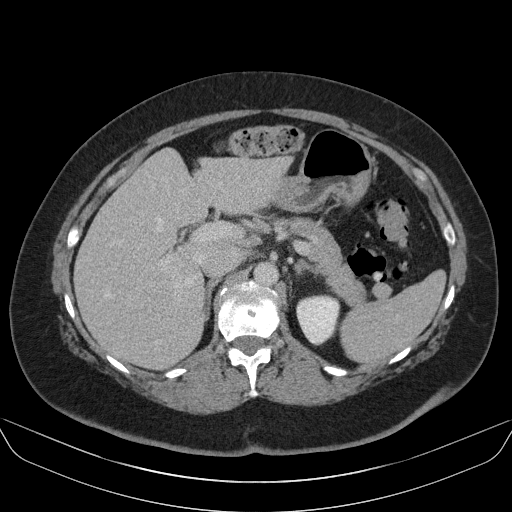
[im 65/82  soft-tissue]
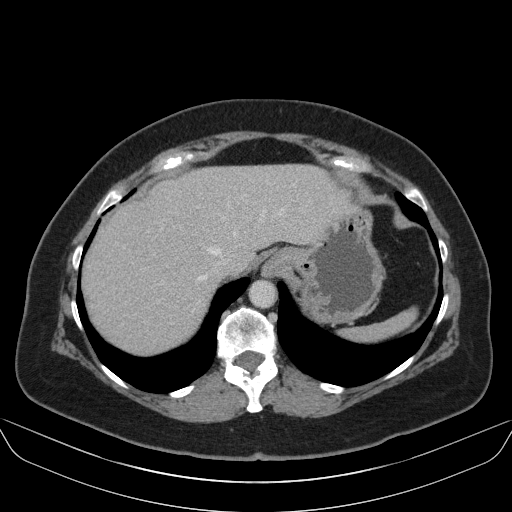

[13 of 32 positions shown; findings below may reference images not displayed]

FINDINGS: Lower chest: Lung bases are clear.

Hepatobiliary: No focal, suspicious hepatic lesion. Portal vein is
patent. No pericholecystic stranding or biliary duct dilation.

Pancreas: Normal

Spleen: Normal

Adrenals/Urinary Tract: Adrenal glands are normal.

Symmetric renal enhancement. Mild RIGHT renal cortical scarring in
the interpolar RIGHT kidney 1.6 x 1.5 cm area arising from the upper
pole the RIGHT kidney shows baseline density of 20 Hounsfield units,
postcontrast density of 24 Hounsfield units and delayed density of
17 Hounsfield units. No suspicious renal lesions are demonstrated.
No hydronephrosis.

Stomach/Bowel: Small hiatal hernia. Otherwise unremarkable
appearance of the visualized gastrointestinal tract.

Vascular/Lymphatic: Minimal atherosclerotic calcification of the
abdominal aorta. No aneurysmal dilation. There is no gastrohepatic
or hepatoduodenal ligament lymphadenopathy. No retroperitoneal or
mesenteric lymphadenopathy.

Other: No ascites

Musculoskeletal: Spinal degenerative changes without acute or
destructive bone process
IMPRESSION: 1. Area of concern arising from the upper pole the RIGHT kidney
represents a cyst perhaps with small amount of proteinaceous or
hemorrhagic contents, no mural nodularity or suspicious features,
Bosniak category II.
2. Small hiatal hernia.
3. Aortic atherosclerosis.

Aortic Atherosclerosis ([id number]).
